# Patient Record
Sex: Female | Born: 2000 | Race: Black or African American | Hispanic: No | Marital: Single | Smoking: Never smoker
Health system: Southern US, Community
[De-identification: ages and names within clinical notes are randomized; demographics above are authoritative.]

---
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
## 2019-11-03 ENCOUNTER — Other Ambulatory Visit: Payer: Self-pay

## 2019-11-03 ENCOUNTER — Encounter (HOSPITAL_COMMUNITY): Payer: Self-pay

## 2019-11-03 ENCOUNTER — Ambulatory Visit (HOSPITAL_COMMUNITY)
Admission: EM | Admit: 2019-11-03 | Discharge: 2019-11-03 | Disposition: A | Discharge status: Home / Self Care | Payer: BC Managed Care – PPO | Attending: Family Medicine | Admitting: Family Medicine

## 2019-11-03 DIAGNOSIS — N3001 Acute cystitis with hematuria: Secondary | ICD-10-CM | POA: Insufficient documentation

## 2019-11-03 DIAGNOSIS — Z113 Encounter for screening for infections with a predominantly sexual mode of transmission: Secondary | ICD-10-CM | POA: Insufficient documentation

## 2019-11-03 DIAGNOSIS — R1030 Lower abdominal pain, unspecified: Secondary | ICD-10-CM | POA: Diagnosis present

## 2019-11-03 DIAGNOSIS — Z3202 Encounter for pregnancy test, result negative: Secondary | ICD-10-CM

## 2019-11-03 DIAGNOSIS — R319 Hematuria, unspecified: Secondary | ICD-10-CM | POA: Diagnosis not present

## 2019-11-03 LAB — POCT URINALYSIS DIPSTICK, ED / UC
Bilirubin Urine: NEGATIVE
Glucose, UA: NEGATIVE mg/dL
Ketones, ur: NEGATIVE mg/dL
Nitrite: NEGATIVE
Protein, ur: 100 mg/dL — AB
Specific Gravity, Urine: 1.025 (ref 1.005–1.030)
Urobilinogen, UA: 0.2 mg/dL (ref 0.0–1.0)
pH: 6 (ref 5.0–8.0)

## 2019-11-03 LAB — POC URINE PREG, ED: Preg Test, Ur: NEGATIVE

## 2019-11-03 MED ORDER — FLUCONAZOLE 150 MG PO TABS: 150.0000 mg | ORAL_TABLET | Freq: Every day | ORAL | 0 refills | Status: DC

## 2019-11-03 MED ORDER — NITROFURANTOIN MONOHYD MACRO 100 MG PO CAPS: 100.0000 mg | ORAL_CAPSULE | Freq: Two times a day (BID) | ORAL | 0 refills | Status: DC

## 2019-11-03 NOTE — Discharge Instructions (Addendum)
Treating you for urinary tract infection.  Make sure you are drinking plenty of fluids Sending urine for culture.  Also sending swab for testing will call you with any positive results Follow up as needed for continued or worsening symptoms

## 2019-11-03 NOTE — ED Triage Notes (Addendum)
Patient in with c/o of lower abdomen pain, and cramping after urination that started Tuesday. Also noticed blood in her urine that stopped this morning Has had Bv before, states that she feels irritation   Requesting routine sti screening  Denies n/v, fever, itching, burning, or discharge

## 2019-11-03 NOTE — ED Provider Notes (Signed)
[facility]    CSN: [id number] Arrival date & time: 11/03/19  0900      History   Chief Complaint No chief complaint on file.   HPI Meagan Smith is a 19 y.o. female.   Patient is a 19 year old female presents today for complaints of lower abdominal pain, cramping after urination and starting Tuesday.  Noticed blood in her urine.  Concern for UTI.  Denies any flank pain, nausea or fevers.  Also would like to be checked for BV and STDs.  Has history of BV.  She has had some mild discharge. Patient's last menstrual period was 11/16/2018 (exact date).      History reviewed. No pertinent past medical history.  There are no problems to display for this patient.   History reviewed. No pertinent surgical history.  OB History   No obstetric history on file.      Home Medications    Prior to Admission medications   Medication Sig Start Date End Date Taking? Authorizing Provider  nitrofurantoin, macrocrystal-monohydrate, (MACROBID) 100 MG capsule Take 1 capsule (100 mg total) by mouth 2 (two) times daily. 11/03/19   Janace Aris, NP    Family History Family History  Problem Relation Age of Onset  . Healthy Mother   . Healthy Father     Social History Social History   Tobacco Use  . Smoking status: Never Smoker  . Smokeless tobacco: Never Used  Substance Use Topics  . Alcohol use: Never  . Drug use: Never     Allergies   Patient has no known allergies.   Review of Systems Review of Systems   Physical Exam Triage Vital Signs ED Triage Vitals  Enc Vitals Group     BP 11/03/19 1003 106/65     Pulse Rate 11/03/19 1003 70     Resp 11/03/19 1003 16     Temp 11/03/19 1003 98.4 F (36.9 C)     Temp Source 11/03/19 1003 Oral     SpO2 11/03/19 1003 100 %     Weight --      Height --      Head Circumference --      Peak Flow --      Pain Score 11/03/19 1004 0     Pain Loc --      Pain Edu? --      Excl. in GC? --    No data  found.  Updated Vital Signs BP 106/65 (BP Location: Left Arm)   Pulse 70   Temp 98.4 F (36.9 C) (Oral)   Resp 16   LMP 11/16/2018 (Exact Date)   SpO2 100%   Visual Acuity Right Eye Distance:   Left Eye Distance:   Bilateral Distance:    Right Eye Near:   Left Eye Near:    Bilateral Near:     Physical Exam Vitals and nursing note reviewed.  Constitutional:      General: She is not in acute distress.    Appearance: Normal appearance. She is not ill-appearing, toxic-appearing or diaphoretic.  HENT:     Head: Normocephalic.     Nose: Nose normal.  Eyes:     Conjunctiva/sclera: Conjunctivae normal.  Pulmonary:     Effort: Pulmonary effort is normal.  Musculoskeletal:        General: Normal range of motion.     Cervical back: Normal range of motion.  Skin:    General: Skin is warm and dry.  Findings: No rash.  Neurological:     Mental Status: She is alert.  Psychiatric:        Mood and Affect: Mood normal.      UC Treatments / Results  Labs (all labs ordered are listed, but only abnormal results are displayed) Labs Reviewed  POCT URINALYSIS DIPSTICK, ED / UC - Abnormal; Notable for the following components:      Result Value   Hgb urine dipstick MODERATE (*)    Protein, ur 100 (*)    Leukocytes,Ua MODERATE (*)    All other components within normal limits  URINE CULTURE  POC URINE PREG, ED  CERVICOVAGINAL ANCILLARY ONLY    EKG   Radiology No results found.  Procedures Procedures (including critical care time)  Medications Ordered in UC Medications - No data to display  Initial Impression / Assessment and Plan / UC Course  I have reviewed the triage vital signs and the nursing notes.  Pertinent labs & imaging results that were available during my care of the patient were reviewed by me and considered in my medical decision making (see chart for details).     Acute cystitis with hematuria Urine with moderate leuks and moderate blood Sending  for culture.  Will blood and treat for urinary tract infection pending results. Also sending swab for testing. Push fluids Follow up as needed for continued or worsening symptoms  Final Clinical Impressions(s) / UC Diagnoses   Final diagnoses:  Acute cystitis with hematuria     Discharge Instructions     Treating you for urinary tract infection.  Make sure you are drinking plenty of fluids Sending urine for culture.  Also sending swab for testing will call you with any positive results Follow up as needed for continued or worsening symptoms     ED Prescriptions    Medication Sig Dispense Auth. Provider   nitrofurantoin, macrocrystal-monohydrate, (MACROBID) 100 MG capsule Take 1 capsule (100 mg total) by mouth 2 (two) times daily. 10 capsule Dahlia Byes A, NP     PDMP not reviewed this encounter.   Dahlia Byes A, NP 11/03/19 1105

## 2019-11-05 LAB — URINE CULTURE: Culture: >=100000 — AB

## 2019-11-06 LAB — CERVICOVAGINAL ANCILLARY ONLY
Bacterial Vaginitis (gardnerella): POSITIVE — AB
Candida Glabrata: NEGATIVE
Candida Vaginitis: POSITIVE — AB
Chlamydia: NEGATIVE
Comment: NEGATIVE
Comment: NEGATIVE
Comment: NEGATIVE
Comment: NEGATIVE
Comment: NEGATIVE
Comment: NORMAL
Neisseria Gonorrhea: NEGATIVE
Trichomonas: NEGATIVE

## 2019-11-07 ENCOUNTER — Telehealth (HOSPITAL_COMMUNITY): Payer: Self-pay | Admitting: Emergency Medicine

## 2019-11-07 MED ORDER — SULFAMETHOXAZOLE-TRIMETHOPRIM 800-160 MG PO TABS: 1.0000 | ORAL_TABLET | Freq: Two times a day (BID) | ORAL | 0 refills | Status: AC

## 2019-11-07 MED ORDER — METRONIDAZOLE 500 MG PO TABS
500.0000 mg | ORAL_TABLET | Freq: Two times a day (BID) | ORAL | 0 refills | Status: DC
Start: 2019-11-07 — End: 2019-11-25

## 2019-11-25 ENCOUNTER — Ambulatory Visit (HOSPITAL_COMMUNITY)
Admission: EM | Admit: 2019-11-25 | Discharge: 2019-11-25 | Disposition: A | Discharge status: Home / Self Care | Payer: BC Managed Care – PPO | Attending: Family Medicine | Admitting: Family Medicine

## 2019-11-25 ENCOUNTER — Other Ambulatory Visit: Payer: Self-pay

## 2019-11-25 ENCOUNTER — Encounter (HOSPITAL_COMMUNITY): Payer: Self-pay

## 2019-11-25 DIAGNOSIS — Z3202 Encounter for pregnancy test, result negative: Secondary | ICD-10-CM

## 2019-11-25 DIAGNOSIS — Z87440 Personal history of urinary (tract) infections: Secondary | ICD-10-CM | POA: Insufficient documentation

## 2019-11-25 DIAGNOSIS — N898 Other specified noninflammatory disorders of vagina: Secondary | ICD-10-CM | POA: Diagnosis present

## 2019-11-25 DIAGNOSIS — Z113 Encounter for screening for infections with a predominantly sexual mode of transmission: Secondary | ICD-10-CM | POA: Diagnosis not present

## 2019-11-25 DIAGNOSIS — N390 Urinary tract infection, site not specified: Secondary | ICD-10-CM | POA: Diagnosis not present

## 2019-11-25 LAB — POCT URINALYSIS DIPSTICK, ED / UC
Glucose, UA: NEGATIVE mg/dL
Ketones, ur: 80 mg/dL — AB
Leukocytes,Ua: NEGATIVE
Nitrite: NEGATIVE
Protein, ur: 100 mg/dL — AB
Specific Gravity, Urine: >=1.03 (ref 1.005–1.030)
Urobilinogen, UA: 0.2 mg/dL (ref 0.0–1.0)
pH: 6 (ref 5.0–8.0)

## 2019-11-25 LAB — POC URINE PREG, ED: Preg Test, Ur: NEGATIVE

## 2019-11-25 MED ORDER — FLUCONAZOLE 150 MG PO TABS: 150.0000 mg | ORAL_TABLET | Freq: Once | ORAL | 0 refills | Status: AC

## 2019-11-25 NOTE — Discharge Instructions (Signed)
Drink plenty of fluids  We are testing you for Gonorrhea, Chlamydia, Trichomonas, Yeast and Bacterial Vaginosis. We will call you if anything is positive and let you know if you require any further treatment. Please inform partners of any positive results.   Please return if symptoms not improving with treatment, development of fever, nausea, vomiting, abdominal pain.

## 2019-11-25 NOTE — ED Provider Notes (Signed)
[facility]    CSN: [id number] Arrival date & time: 11/25/19  1143      History   Chief Complaint Chief Complaint  Patient presents with   Urinary Tract Infection    HPI Meagan Smith is a 19 y.o. female presenting today for evaluation of possible yeast infection.  Patient was here recently on 10/7 and had UTI, yeast and BV.  Reports she did not feel the symptoms fully resolved and she has continued to have discharge.  She reports that she did not regularly take the antibiotics because of this feels the yeast was not eradicated with taking the tablets prior to completion of antibiotics.  Feels UTI symptoms have resolved.  Denies itching or irritation.  Last menstrual cycle around 10/11.  Has felt slightly off generally, has difficulty describing this or any specific symptoms.  HPI  History reviewed. No pertinent past medical history.  There are no problems to display for this patient.   History reviewed. No pertinent surgical history.  OB History   No obstetric history on file.      Home Medications    Prior to Admission medications   Medication Sig Start Date End Date Taking? Authorizing Provider  fluconazole (DIFLUCAN) 150 MG tablet Take 1 tablet (150 mg total) by mouth once for 1 dose. 11/25/19 11/25/19  Rabiah Goeser, Junius Creamer, PA-C    Family History Family History  Problem Relation Age of Onset   Healthy Mother    Healthy Father     Social History Social History   Tobacco Use   Smoking status: Never Smoker   Smokeless tobacco: Never Used  Substance Use Topics   Alcohol use: Never   Drug use: Never     Allergies   Patient has no known allergies.   Review of Systems Review of Systems  Constitutional: Negative for fever.  Respiratory: Negative for shortness of breath.   Cardiovascular: Negative for chest pain.  Gastrointestinal: Negative for abdominal pain, diarrhea, nausea and vomiting.  Genitourinary: Positive for vaginal  discharge. Negative for dysuria, flank pain, genital sores, hematuria, menstrual problem, vaginal bleeding and vaginal pain.  Musculoskeletal: Negative for back pain.  Skin: Negative for rash.  Neurological: Negative for dizziness, light-headedness and headaches.     Physical Exam Triage Vital Signs ED Triage Vitals  Enc Vitals Group     BP 11/25/19 1219 117/77     Pulse Rate 11/25/19 1219 (!) 105     Resp 11/25/19 1219 18     Temp 11/25/19 1219 99.3 F (37.4 C)     Temp Source 11/25/19 1219 Oral     SpO2 11/25/19 1219 96 %     Weight --      Height --      Head Circumference --      Peak Flow --      Pain Score 11/25/19 1218 0     Pain Loc --      Pain Edu? --      Excl. in GC? --    No data found.  Updated Vital Signs BP 117/77 (BP Location: Right Arm)    Pulse (!) 105    Temp 99.3 F (37.4 C) (Oral)    Resp 18    LMP 11/07/2019    SpO2 96%   Visual Acuity Right Eye Distance:   Left Eye Distance:   Bilateral Distance:    Right Eye Near:   Left Eye Near:    Bilateral Near:  Physical Exam Vitals and nursing note reviewed.  Constitutional:      Appearance: She is well-developed.     Comments: No acute distress  HENT:     Head: Normocephalic and atraumatic.     Nose: Nose normal.  Eyes:     Conjunctiva/sclera: Conjunctivae normal.  Cardiovascular:     Rate and Rhythm: Normal rate.  Pulmonary:     Effort: Pulmonary effort is normal. No respiratory distress.  Abdominal:     General: There is no distension.  Musculoskeletal:        General: Normal range of motion.     Cervical back: Neck supple.  Skin:    General: Skin is warm and dry.  Neurological:     Mental Status: She is alert and oriented to person, place, and time.      UC Treatments / Results  Labs (all labs ordered are listed, but only abnormal results are displayed) Labs Reviewed  POCT URINALYSIS DIPSTICK, ED / UC - Abnormal; Notable for the following components:      Result Value    Bilirubin Urine MODERATE (*)    Ketones, ur 80 (*)    Hgb urine dipstick TRACE (*)    Protein, ur 100 (*)    All other components within normal limits  POC URINE PREG, ED  CERVICOVAGINAL ANCILLARY ONLY    EKG   Radiology No results found.  Procedures Procedures (including critical care time)  Medications Ordered in UC Medications - No data to display  Initial Impression / Assessment and Plan / UC Course  I have reviewed the triage vital signs and the nursing notes.  Pertinent labs & imaging results that were available during my care of the patient were reviewed by me and considered in my medical decision making (see chart for details).     UA with negative leuks and nitrites, does appear concentrated/dehydrated, stressed pushing fluids.  Empirically treating for yeast with Diflucan.  Repeat vaginal swab pending to rescreen for any persistent vaginal infections.  Will alter therapy as needed based off results.  Discussed strict return precautions. Patient verbalized understanding and is agreeable with plan.  Final Clinical Impressions(s) / UC Diagnoses   Final diagnoses:  Vaginal discharge     Discharge Instructions     Drink plenty of fluids  We are testing you for Gonorrhea, Chlamydia, Trichomonas, Yeast and Bacterial Vaginosis. We will call you if anything is positive and let you know if you require any further treatment. Please inform partners of any positive results.   Please return if symptoms not improving with treatment, development of fever, nausea, vomiting, abdominal pain.    ED Prescriptions    Medication Sig Dispense Auth. Provider   fluconazole (DIFLUCAN) 150 MG tablet Take 1 tablet (150 mg total) by mouth once for 1 dose. 2 tablet Mary Hockey, [place] C, PA-C     PDMP not reviewed this encounter.   Lew Dawes, PA-C 11/25/19 1252

## 2019-11-25 NOTE — ED Triage Notes (Signed)
Patient in stating that she just feels "off" since her period on 11/07/19. Was recently treated about 3 weeks ago for BV/ UTI.    states she finished prescribed antibiotic but was not taking it as scheduled and that she feels she might have yeast infection  States she took at home preg test with negative results  Denies vaginal irritation, dysuria, or vaginal discharge

## 2019-11-28 LAB — CERVICOVAGINAL ANCILLARY ONLY
Bacterial Vaginitis (gardnerella): NEGATIVE
Candida Glabrata: NEGATIVE
Candida Vaginitis: POSITIVE — AB
Chlamydia: NEGATIVE
Comment: NEGATIVE
Comment: NEGATIVE
Comment: NEGATIVE
Comment: NEGATIVE
Comment: NEGATIVE
Comment: NORMAL
Neisseria Gonorrhea: NEGATIVE
Trichomonas: NEGATIVE

## 2021-12-24 DIAGNOSIS — F411 Generalized anxiety disorder: Secondary | ICD-10-CM | POA: Diagnosis not present

## 2021-12-31 DIAGNOSIS — F411 Generalized anxiety disorder: Secondary | ICD-10-CM | POA: Diagnosis not present

## 2022-01-07 DIAGNOSIS — F411 Generalized anxiety disorder: Secondary | ICD-10-CM | POA: Diagnosis not present

## 2022-05-01 DIAGNOSIS — Z113 Encounter for screening for infections with a predominantly sexual mode of transmission: Secondary | ICD-10-CM | POA: Diagnosis not present

## 2022-05-01 DIAGNOSIS — Z114 Encounter for screening for human immunodeficiency virus [HIV]: Secondary | ICD-10-CM | POA: Diagnosis not present

## 2022-06-03 DIAGNOSIS — F411 Generalized anxiety disorder: Secondary | ICD-10-CM | POA: Diagnosis not present

## 2022-06-17 DIAGNOSIS — F411 Generalized anxiety disorder: Secondary | ICD-10-CM | POA: Diagnosis not present

## 2022-07-01 DIAGNOSIS — F411 Generalized anxiety disorder: Secondary | ICD-10-CM | POA: Diagnosis not present

## 2022-07-15 DIAGNOSIS — F411 Generalized anxiety disorder: Secondary | ICD-10-CM | POA: Diagnosis not present

## 2022-07-29 DIAGNOSIS — F411 Generalized anxiety disorder: Secondary | ICD-10-CM | POA: Diagnosis not present

## 2022-08-12 DIAGNOSIS — F411 Generalized anxiety disorder: Secondary | ICD-10-CM | POA: Diagnosis not present

## 2022-08-26 DIAGNOSIS — F411 Generalized anxiety disorder: Secondary | ICD-10-CM | POA: Diagnosis not present

## 2022-09-09 DIAGNOSIS — F411 Generalized anxiety disorder: Secondary | ICD-10-CM | POA: Diagnosis not present

## 2022-09-23 DIAGNOSIS — F411 Generalized anxiety disorder: Secondary | ICD-10-CM | POA: Diagnosis not present

## 2022-10-07 DIAGNOSIS — F411 Generalized anxiety disorder: Secondary | ICD-10-CM | POA: Diagnosis not present

## 2022-10-21 DIAGNOSIS — F411 Generalized anxiety disorder: Secondary | ICD-10-CM | POA: Diagnosis not present

## 2022-11-05 DIAGNOSIS — F411 Generalized anxiety disorder: Secondary | ICD-10-CM | POA: Diagnosis not present

## 2022-11-18 DIAGNOSIS — F411 Generalized anxiety disorder: Secondary | ICD-10-CM | POA: Diagnosis not present

## 2023-04-24 ENCOUNTER — Ambulatory Visit (HOSPITAL_COMMUNITY)
Admission: EM | Admit: 2023-04-24 | Discharge: 2023-04-24 | Disposition: A | Discharge status: Home / Self Care | Attending: Emergency Medicine | Admitting: Emergency Medicine

## 2023-04-24 ENCOUNTER — Encounter (HOSPITAL_COMMUNITY): Payer: Self-pay | Admitting: *Deleted

## 2023-04-24 DIAGNOSIS — H5789 Other specified disorders of eye and adnexa: Secondary | ICD-10-CM | POA: Diagnosis not present

## 2023-04-24 DIAGNOSIS — H00014 Hordeolum externum left upper eyelid: Secondary | ICD-10-CM | POA: Diagnosis not present

## 2023-04-24 MED ORDER — AMOXICILLIN-POT CLAVULANATE 875-125 MG PO TABS: 1.0000 | ORAL_TABLET | Freq: Two times a day (BID) | ORAL | 0 refills | Status: DC

## 2023-04-24 MED ORDER — ERYTHROMYCIN 5 MG/GM OP OINT: TOPICAL_OINTMENT | OPHTHALMIC | 0 refills | Status: DC

## 2023-04-24 NOTE — ED Triage Notes (Signed)
 Pt states she had a stye on her left eye she used a potato to decrease her stye. She states she slept with the potato on her eye and her contact in her eye.  Now she has a lot of eye irritation.

## 2023-04-24 NOTE — ED Provider Notes (Signed)
 [facility]    CSN: [id number] Arrival date & time: 04/24/23  0809      History   Chief Complaint Chief Complaint  Patient presents with   Eye Problem    HPI Meagan Smith is a 23 y.o. female.   Patient presents today with left eye swelling and erythema and a bump above the upper lid.  Patient states that this has been going on for approximately 3 days.  Last night she slept with a contact in and read on the Internet to apply a potato to her eye.  This does not make the area any better.  Patient denies any visual changes no pressure behind the eye.  Does have an ophthalmologist that she sees regularly    History reviewed. No pertinent past medical history.  There are no active problems to display for this patient.   History reviewed. No pertinent surgical history.  OB History   No obstetric history on file.      Home Medications    Prior to Admission medications   Medication Sig Start Date End Date Taking? Authorizing Provider  amoxicillin-clavulanate (AUGMENTIN) 875-125 MG tablet Take 1 tablet by mouth every 12 (twelve) hours. 04/24/23  Yes Coralyn Mark, NP  erythromycin ophthalmic ointment Place a 1/2 inch ribbon of ointment into the lower eyelid. 04/24/23  Yes Coralyn Mark, NP    Family History Family History  Problem Relation Age of Onset   Healthy Mother    Healthy Father     Social History Social History   Tobacco Use   Smoking status: Never   Smokeless tobacco: Never  Vaping Use   Vaping status: Never Used  Substance Use Topics   Alcohol use: Never   Drug use: Never     Allergies   Patient has no known allergies.   Review of Systems Review of Systems  Constitutional: Negative.   Eyes:  Positive for discharge and redness. Negative for photophobia, pain, itching and visual disturbance.  Respiratory: Negative.    Cardiovascular: Negative.   Skin:        Small amount of edema noted to surrounding tissue of the  left eye small pea-sized nodule of the above left upper lid slight redness  Neurological: Negative.      Physical Exam Triage Vital Signs ED Triage Vitals  Encounter Vitals Group     BP 04/24/23 0832 102/64     Systolic BP Percentile --      Diastolic BP Percentile --      Pulse Rate 04/24/23 0832 76     Resp 04/24/23 0832 16     Temp 04/24/23 0832 97.8 F (36.6 C)     Temp Source 04/24/23 0832 Oral     SpO2 04/24/23 0832 100 %     Weight --      Height --      Head Circumference --      Peak Flow --      Pain Score 04/24/23 0831 0     Pain Loc --      Pain Education --      Exclude from Growth Chart --    No data found.  Updated Vital Signs BP 102/64 (BP Location: Right Arm)   Pulse 76   Temp 97.8 F (36.6 C) (Oral)   Resp 16   LMP 03/31/2023 (Approximate)   SpO2 100%   Visual Acuity Right Eye Distance:   Left Eye Distance:   Bilateral Distance:  Right Eye Near:   Left Eye Near:    Bilateral Near:     Physical Exam Constitutional:      Appearance: Normal appearance.  Eyes:     General:        Left eye: Discharge present.    Extraocular Movements: Extraocular movements intact.     Conjunctiva/sclera: Conjunctivae normal.     Pupils: Pupils are equal, round, and reactive to light.      Comments: Slight erythema and small amount of edema noted around left lower lid.  Patient has nodule level of the upper bleph   Cardiovascular:     Rate and Rhythm: Normal rate.  Pulmonary:     Effort: Pulmonary effort is normal.  Skin:    Findings: Erythema present.  Neurological:     General: No focal deficit present.     Mental Status: She is alert.      UC Treatments / Results  Labs (all labs ordered are listed, but only abnormal results are displayed) Labs Reviewed - No data to display  EKG   Radiology No results found.  Procedures Procedures (including critical care time)  Medications Ordered in UC Medications - No data to display  Initial  Impression / Assessment and Plan / UC Course  I have reviewed the triage vital signs and the nursing notes.  Pertinent labs & imaging results that were available during my care of the patient were reviewed by me and considered in my medical decision making (see chart for details).     Use warm compresses several times throughout the day Avoid wearing any contacts for at least 3 to 5 days Follow-up with ophthalmology Can use saline drops as needed If symptoms come worse or pressure behind the eye patient will need to follow-up in the ER We will call or cover for possible cellulitis to surronding tissues   Final Clinical Impressions(s) / UC Diagnoses   Final diagnoses:  Hordeolum externum of left upper eyelid  Eye irritation     Discharge Instructions      Use warm compresses several times throughout the day Avoid wearing any contacts for at least 3 to 5 days Follow-up with ophthalmology Can use saline drops as needed If symptoms come worse or pressure behind the eye patient will need to follow-up in the ER     ED Prescriptions     Medication Sig Dispense Auth. Provider   erythromycin ophthalmic ointment Place a 1/2 inch ribbon of ointment into the lower eyelid. 3.5 g Maple Mirza L, NP   amoxicillin-clavulanate (AUGMENTIN) 875-125 MG tablet Take 1 tablet by mouth every 12 (twelve) hours. 14 tablet Coralyn Mark, NP      PDMP not reviewed this encounter.   Coralyn Mark, NP 04/24/23 [phone]

## 2023-04-24 NOTE — Discharge Instructions (Signed)
 Use warm compresses several times throughout the day Avoid wearing any contacts for at least 3 to 5 days Follow-up with ophthalmology Can use saline drops as needed If symptoms come worse or pressure behind the eye patient will need to follow-up in the ER

## 2023-08-13 ENCOUNTER — Encounter (HOSPITAL_COMMUNITY): Payer: Self-pay | Admitting: *Deleted

## 2023-08-13 ENCOUNTER — Ambulatory Visit (HOSPITAL_COMMUNITY)
Admission: EM | Admit: 2023-08-13 | Discharge: 2023-08-13 | Disposition: A | Discharge status: Home / Self Care | Attending: Family Medicine | Admitting: Family Medicine

## 2023-08-13 ENCOUNTER — Other Ambulatory Visit: Payer: Self-pay

## 2023-08-13 DIAGNOSIS — M2550 Pain in unspecified joint: Secondary | ICD-10-CM | POA: Insufficient documentation

## 2023-08-13 DIAGNOSIS — R5383 Other fatigue: Secondary | ICD-10-CM | POA: Diagnosis not present

## 2023-08-13 DIAGNOSIS — R531 Weakness: Secondary | ICD-10-CM | POA: Insufficient documentation

## 2023-08-13 LAB — SEDIMENTATION RATE: Sed Rate: 2 mm/h (ref 0–22)

## 2023-08-13 LAB — CBC WITH DIFFERENTIAL/PLATELET
Abs Immature Granulocytes: 0.01 K/uL (ref 0.00–0.07)
Basophils Absolute: 0.1 K/uL (ref 0.0–0.1)
Basophils Relative: 1 %
Eosinophils Absolute: 0.5 K/uL (ref 0.0–0.5)
Eosinophils Relative: 8 %
HCT: 40.5 % (ref 36.0–46.0)
Hemoglobin: 12.6 g/dL (ref 12.0–15.0)
Immature Granulocytes: 0 %
Lymphocytes Relative: 22 %
Lymphs Abs: 1.3 K/uL (ref 0.7–4.0)
MCH: 26.4 pg (ref 26.0–34.0)
MCHC: 31.1 g/dL (ref 30.0–36.0)
MCV: 84.9 fL (ref 80.0–100.0)
Monocytes Absolute: 0.3 K/uL (ref 0.1–1.0)
Monocytes Relative: 5 %
Neutro Abs: 3.8 K/uL (ref 1.7–7.7)
Neutrophils Relative %: 64 %
Platelets: 329 K/uL (ref 150–400)
RBC: 4.77 MIL/uL (ref 3.87–5.11)
RDW: 12.7 % (ref 11.5–15.5)
WBC: 5.9 K/uL (ref 4.0–10.5)
nRBC: 0 % (ref 0.0–0.2)

## 2023-08-13 LAB — COMPREHENSIVE METABOLIC PANEL WITH GFR
ALT: 14 U/L (ref 0–44)
AST: 20 U/L (ref 15–41)
Albumin: 4.1 g/dL (ref 3.5–5.0)
Alkaline Phosphatase: 77 U/L (ref 38–126)
Anion gap: 9 (ref 5–15)
BUN: 7 mg/dL (ref 6–20)
CO2: 24 mmol/L (ref 22–32)
Calcium: 9.6 mg/dL (ref 8.9–10.3)
Chloride: 103 mmol/L (ref 98–111)
Creatinine, Ser: 0.7 mg/dL (ref 0.44–1.00)
GFR, Estimated: >60 mL/min (ref >60)
Glucose, Bld: 70 mg/dL (ref 70–99)
Potassium: 3.9 mmol/L (ref 3.5–5.1)
Sodium: 136 mmol/L (ref 135–145)
Total Bilirubin: 0.5 mg/dL (ref 0.0–1.2)
Total Protein: 7.4 g/dL (ref 6.5–8.1)

## 2023-08-13 LAB — VITAMIN B12: Vitamin B-12: 352 pg/mL (ref 180–914)

## 2023-08-13 LAB — POCT FASTING CBG KUC MANUAL ENTRY: POCT Glucose (KUC): 101 mg/dL — AB (ref 70–99)

## 2023-08-13 NOTE — ED Triage Notes (Signed)
 C/O generalized weakness onset last night; states started feeling like her arms were weak and heavy while doing her hair last night, then noticed she just felt generalized weakness.  Also states lately I've been experiencing joint pain.

## 2023-08-13 NOTE — Discharge Instructions (Addendum)
 Your sugar was 101, which was normal.  We have drawn blood to check your blood counts, electrolytes and kidney and liver function, thyroid function, B12, and markers for inflammation.  If anything is significantly abnormal, our staff will call you.  If it is all normal they will not.  All results will go directly to your MyChart also.  If you worsen significantly in any way, including significant shortness of breath or dizziness, please go to the emergency room for further evaluation  You can use the QR code/website at the back of the summary paperwork to schedule yourself a new patient appointment with primary care

## 2023-08-13 NOTE — ED Provider Notes (Signed)
 [facility]    CSN: [id number] Arrival date & time: 08/13/23  0843      History   Chief Complaint Chief Complaint  Patient presents with   Weakness    HPI Meagan Smith is a 23 y.o. female.    Weakness  Here for weakness and fatigue.  It began for sure last night where she felt like her arms were very heavy.  Today it is even more pronounced and she feels like it is just hard to move or walk.  Maybe a little short of breath.  No fever or chills and no upper respiratory symptoms.  No nausea or vomiting or diarrhea.  Bowel movements have been normal.  She does have an occasional suprapubic pain, but she wonders if that is because she is ovulating.  No rash and no swelling, but she has had some intermittent joint pains, including in her hands and wrists and possibly ankles and feet.  The hands and wrist bothered her specifically when she had been doing her hair a couple of nights ago.  Last menstrual cycle was July 4, and was on time.  Family history is negative for diabetes or autoimmune disease that she knows of.   she has felt tired are off sometimes after eating a meal.    History reviewed. No pertinent past medical history.  There are no active problems to display for this patient.   History reviewed. No pertinent surgical history.  OB History   No obstetric history on file.      Home Medications    Prior to Admission medications   Not on File    Family History Family History  Problem Relation Age of Onset   Healthy Mother    Healthy Father     Social History Social History   Tobacco Use   Smoking status: Never   Smokeless tobacco: Never  Vaping Use   Vaping status: Never Used  Substance Use Topics   Alcohol use: Yes    Comment: occasional wine   Drug use: Never     Allergies   Patient has no known allergies.   Review of Systems Review of Systems  Neurological:  Positive for weakness.     Physical Exam Triage Vital  Signs ED Triage Vitals [08/13/23 0906]  Encounter Vitals Group     BP 108/67     Girls Systolic BP Percentile      Girls Diastolic BP Percentile      Boys Systolic BP Percentile      Boys Diastolic BP Percentile      Pulse Rate 66     Resp 16     Temp 98.3 F (36.8 C)     Temp Source Oral     SpO2 98 %     Weight      Height      Head Circumference      Peak Flow      Pain Score 0     Pain Loc      Pain Education      Exclude from Growth Chart    No data found.  Updated Vital Signs BP 108/67   Pulse 66   Temp 98.3 F (36.8 C) (Oral)   Resp 16   LMP 07/31/2023 (Approximate)   SpO2 98%   Visual Acuity Right Eye Distance:   Left Eye Distance:   Bilateral Distance:    Right Eye Near:   Left Eye Near:    Bilateral Near:  Physical Exam Vitals reviewed.  Constitutional:      General: She is not in acute distress.    Appearance: She is not toxic-appearing.  HENT:     Nose: Nose normal.     Mouth/Throat:     Mouth: Mucous membranes are moist.     Pharynx: No oropharyngeal exudate or posterior oropharyngeal erythema.  Eyes:     Extraocular Movements: Extraocular movements intact.     Conjunctiva/sclera: Conjunctivae normal.     Pupils: Pupils are equal, round, and reactive to light.  Cardiovascular:     Rate and Rhythm: Normal rate and regular rhythm.     Heart sounds: No murmur heard. Pulmonary:     Effort: Pulmonary effort is normal. No respiratory distress.     Breath sounds: No stridor. No wheezing, rhonchi or rales.  Abdominal:     General: Bowel sounds are normal. There is no distension.     Palpations: Abdomen is soft.     Tenderness: There is no abdominal tenderness. There is no guarding.  Musculoskeletal:        General: No swelling, tenderness or deformity.     Cervical back: Neck supple.     Right lower leg: No edema.     Left lower leg: No edema.     Comments: No swelling or bogginess of the joints of her wrists or hands.  Lymphadenopathy:      Cervical: No cervical adenopathy.  Skin:    Capillary Refill: Capillary refill takes less than 2 seconds.     Coloration: Skin is not jaundiced or pale.  Neurological:     General: No focal deficit present.     Mental Status: She is alert and oriented to person, place, and time.  Psychiatric:        Behavior: Behavior normal.      UC Treatments / Results  Labs (all labs ordered are listed, but only abnormal results are displayed) Labs Reviewed  POCT FASTING CBG [facility] MANUAL ENTRY - Abnormal; Notable for the following components:      Result Value   POCT Glucose ([facility]) 101 (*)    All other components within normal limits  CBC WITH DIFFERENTIAL/PLATELET  COMPREHENSIVE METABOLIC PANEL WITH GFR  TSH  VITAMIN B12  SEDIMENTATION RATE  RHEUMATOID FACTOR    EKG   Radiology No results found.  Procedures Procedures (including critical care time)  Medications Ordered in UC Medications - No data to display  Initial Impression / Assessment and Plan / UC Course  I have reviewed the triage vital signs and the nursing notes.  Pertinent labs & imaging results that were available during my care of the patient were reviewed by me and considered in my medical decision making (see chart for details).     Fingerstick glucose is 101.  We have drawn labs to assess CBC, CMP, rheumatoid factor and sed rate, B12 and thyroid function.  Our staff will notify everything is significantly abnormal.  She will go to emergency room if she worsens significantly  Staff will assist her in making a PCP appointment.  She does not have a primary care at this time Final Clinical Impressions(s) / UC Diagnoses   Final diagnoses:  Weakness  Fatigue, unspecified type  Arthralgia, unspecified joint     Discharge Instructions      Your sugar was 101, which was normal.  We have drawn blood to check your blood counts, electrolytes and kidney and liver function, thyroid function, B12, and markers  for inflammation.  If anything is significantly abnormal, our staff will call you.  If it is all normal they will not.  All results will go directly to your MyChart also.  If you worsen significantly in any way, including significant shortness of breath or dizziness, please go to the emergency room for further evaluation  You can use the QR code/website at the back of the summary paperwork to schedule yourself a new patient appointment with primary care      ED Prescriptions   None    PDMP not reviewed this encounter.   Vonna Sharlet POUR, MD 08/13/23 [phone]

## 2023-08-14 ENCOUNTER — Ambulatory Visit (HOSPITAL_COMMUNITY): Payer: Self-pay

## 2023-08-14 LAB — RHEUMATOID FACTOR: Rheumatoid fact SerPl-aCnc: <10 [IU]/mL (ref <14.0)

## 2023-09-19 ENCOUNTER — Emergency Department (HOSPITAL_COMMUNITY)

## 2023-09-19 ENCOUNTER — Emergency Department (HOSPITAL_COMMUNITY)
Admission: EM | Admit: 2023-09-19 | Discharge: 2023-09-19 | Disposition: A | Discharge status: Home / Self Care | Attending: Emergency Medicine | Admitting: Emergency Medicine

## 2023-09-19 ENCOUNTER — Encounter (HOSPITAL_COMMUNITY): Payer: Self-pay | Admitting: *Deleted

## 2023-09-19 ENCOUNTER — Other Ambulatory Visit: Payer: Self-pay

## 2023-09-19 DIAGNOSIS — R079 Chest pain, unspecified: Secondary | ICD-10-CM | POA: Diagnosis not present

## 2023-09-19 DIAGNOSIS — R0789 Other chest pain: Secondary | ICD-10-CM | POA: Diagnosis not present

## 2023-09-19 LAB — CBC
HCT: 36.2 % (ref 36.0–46.0)
Hemoglobin: 11.4 g/dL — ABNORMAL LOW (ref 12.0–15.0)
MCH: 27 pg (ref 26.0–34.0)
MCHC: 31.5 g/dL (ref 30.0–36.0)
MCV: 85.6 fL (ref 80.0–100.0)
Platelets: 343 K/uL (ref 150–400)
RBC: 4.23 MIL/uL (ref 3.87–5.11)
RDW: 12.3 % (ref 11.5–15.5)
WBC: 6.6 K/uL (ref 4.0–10.5)
nRBC: 0 % (ref 0.0–0.2)

## 2023-09-19 LAB — BASIC METABOLIC PANEL WITH GFR
Anion gap: 7 (ref 5–15)
BUN: 8 mg/dL (ref 6–20)
CO2: 22 mmol/L (ref 22–32)
Calcium: 9.2 mg/dL (ref 8.9–10.3)
Chloride: 107 mmol/L (ref 98–111)
Creatinine, Ser: 0.76 mg/dL (ref 0.44–1.00)
GFR, Estimated: >60 mL/min (ref >60)
Glucose, Bld: 115 mg/dL — ABNORMAL HIGH (ref 70–99)
Potassium: 3.7 mmol/L (ref 3.5–5.1)
Sodium: 136 mmol/L (ref 135–145)

## 2023-09-19 LAB — HCG, SERUM, QUALITATIVE: Preg, Serum: NEGATIVE

## 2023-09-19 LAB — TROPONIN I (HIGH SENSITIVITY): Troponin I (High Sensitivity): 2 ng/L (ref <18)

## 2023-09-19 MED ORDER — KETOROLAC TROMETHAMINE 15 MG/ML IJ SOLN
15.0000 mg | Freq: Once | INTRAMUSCULAR | Status: AC
  Administered 2023-09-19: 15 mg via INTRAMUSCULAR
  Filled 2023-09-19: qty 1

## 2023-09-19 NOTE — ED Triage Notes (Signed)
 States she went to [facility] 1 month ago with feeling very tired and had blood work done was a work up for inflammation , has an appointment 08/27 however this am woke up with chest pain and headche.

## 2023-09-19 NOTE — Discharge Instructions (Addendum)
 Follow-up with your primary care doctor at your scheduled appointment.  Return for any emergent symptoms.  No concerning findings for your chest pain identified today.  Take ibuprofen 600 mg until your follow-up.

## 2023-09-19 NOTE — ED Provider Notes (Signed)
 [place] [facility] Provider Note   CSN: [id number] Arrival date & time: 09/19/23  1326     Patient presents with: No chief complaint on file.   Meagan Smith is a 23 y.o. female.   73 female presents today for concern of chest pain.  She states this started last night.  Has no prior history of CAD, PE, DVT.  No family history of CAD, or clotting disorder.  Mom is at bedside and helps with history.  Chest pain is not pleuritic or reproducible.  It does wax and wane.  Currently pain is not significant.  Pain does not radiate anywhere.  No associated shortness of breath.  No recent long travel, no leg pain, or leg swelling, no history of cancer or surgery.  The history is provided by the patient. No language interpreter was used.       Prior to Admission medications   Not on File    Allergies: Patient has no known allergies.    Review of Systems  Constitutional:  Negative for chills and fever.  Respiratory:  Negative for cough and shortness of breath.   Cardiovascular:  Positive for chest pain. Negative for palpitations and leg swelling.  Gastrointestinal:  Negative for abdominal pain.  Neurological:  Negative for light-headedness and headaches.  All other systems reviewed and are negative.   Updated Vital Signs BP 122/73 (BP Location: Right Arm)   Pulse 89   Temp 97.8 F (36.6 C)   Resp 15   Ht 5' 7 (1.702 m)   Wt 68 kg   LMP 08/03/2023   SpO2 100%   BMI 23.49 kg/m   Physical Exam Vitals and nursing note reviewed.  Constitutional:      General: She is not in acute distress.    Appearance: Normal appearance. She is not ill-appearing.  HENT:     Head: Normocephalic and atraumatic.     Nose: Nose normal.  Eyes:     Conjunctiva/sclera: Conjunctivae normal.  Cardiovascular:     Rate and Rhythm: Normal rate and regular rhythm.  Pulmonary:     Effort: Pulmonary effort is normal. No respiratory distress.  Musculoskeletal:         General: Normal range of motion.     Cervical back: Normal range of motion.     Right lower leg: No edema.     Left lower leg: No edema.  Skin:    Findings: No rash.  Neurological:     Mental Status: She is alert.     (all labs ordered are listed, but only abnormal results are displayed) Labs Reviewed  BASIC METABOLIC PANEL WITH GFR - Abnormal; Notable for the following components:      Result Value   Glucose, Bld 115 (*)    All other components within normal limits  CBC - Abnormal; Notable for the following components:   Hemoglobin 11.4 (*)    All other components within normal limits  HCG, SERUM, QUALITATIVE  TROPONIN I (HIGH SENSITIVITY)  TROPONIN I (HIGH SENSITIVITY)    EKG: None  Radiology: DG Chest 2 View Result Date: 09/19/2023 CLINICAL DATA:  Chest pain. EXAM: CHEST - 2 VIEW COMPARISON:  Chest radiograph dated 03/19/2015. FINDINGS: The heart size and mediastinal contours are within normal limits. Both lungs are clear. The visualized skeletal structures are unremarkable. IMPRESSION: No active cardiopulmonary disease. Electronically Signed   By: Vanetta Chou M.D.   On: 09/19/2023 15:29     Procedures  Medications Ordered in the ED  ketorolac  (TORADOL ) 15 MG/ML injection 15 mg (has no administration in time range)                                    Medical Decision Making Amount and/or Complexity of Data Reviewed Labs: ordered. Radiology: ordered.  Risk Prescription drug management.   Medical Decision Making / ED Course   This patient presents to the ED for concern of chest pain, this involves an extensive number of treatment options, and is a complaint that carries with it a high risk of complications and morbidity.  The differential diagnosis includes ACS, PE, pneumonia, MSK etiology, GERD, dissection.  Low suspicion for ACS and dissection based off of history and physical exam.  Low risk for PE on Wells criteria.  MDM: 23 year old female  presents today for concern of chest pain. Atypical for ACS.  Troponin negative.  EKG without acute ischemic change.  Chest x-ray without acute cardiopulmonary process.  Low suspicion for ACS.  CBC without leukocytosis.  Mild anemia but not significantly below her baseline.  No evidence of acute GI bleed.  BMP with glucose 115 otherwise without acute concern. Discharged in stable condition.  Return precaution discussed.  Patient voices understanding and is in agreement with plan.  Toradol  provided in the emergency department.  Discussed NSAID trial until she follows up with her PCP on Thursday which is already scheduled.  Patient and mom voiced understanding and are in agreement with plan.   Additional history obtained: -Additional history obtained from Mom at bedside. Chart review. -External records from outside source obtained and reviewed including: Chart review including previous notes, labs, imaging, consultation notes   Lab Tests: -I ordered, reviewed, and interpreted labs.   The pertinent results include:   Labs Reviewed  BASIC METABOLIC PANEL WITH GFR - Abnormal; Notable for the following components:      Result Value   Glucose, Bld 115 (*)    All other components within normal limits  CBC - Abnormal; Notable for the following components:   Hemoglobin 11.4 (*)    All other components within normal limits  HCG, SERUM, QUALITATIVE  TROPONIN I (HIGH SENSITIVITY)  TROPONIN I (HIGH SENSITIVITY)      EKG  EKG Interpretation Date/Time:    Ventricular Rate:    PR Interval:    QRS Duration:    QT Interval:    QTC Calculation:   R Axis:      Text Interpretation:           Imaging Studies ordered: I ordered imaging studies including cxr I independently visualized and interpreted imaging. I agree with the radiologist interpretation   Medicines ordered and prescription drug management: Meds ordered this encounter  Medications   ketorolac  (TORADOL ) 15 MG/ML injection 15  mg    -I have reviewed the patients home medicines and have made adjustments as needed   Reevaluation: After the interventions noted above, I reevaluated the patient and found that they have :improved  Co morbidities that complicate the patient evaluation History reviewed. No pertinent past medical history.    Dispostion: Discharged in stable condition.  Return precaution discussed.  Patient voices understanding and is in agreement with plan.   Final diagnoses:  Atypical chest pain    ED Discharge Orders     None          Hildegard Loge, [place] 09/19/23 1601  Doretha Folks, MD 09/21/23 0001

## 2023-09-23 ENCOUNTER — Encounter: Payer: Self-pay | Admitting: Family Medicine

## 2023-09-23 ENCOUNTER — Ambulatory Visit (INDEPENDENT_AMBULATORY_CARE_PROVIDER_SITE_OTHER): Admitting: Family Medicine

## 2023-09-23 VITALS — BP 99/55 | HR 99 | Resp 16 | Ht 67.0 in | Wt 140.0 lb

## 2023-09-23 DIAGNOSIS — F321 Major depressive disorder, single episode, moderate: Secondary | ICD-10-CM | POA: Diagnosis not present

## 2023-09-23 DIAGNOSIS — R002 Palpitations: Secondary | ICD-10-CM

## 2023-09-23 DIAGNOSIS — F411 Generalized anxiety disorder: Secondary | ICD-10-CM

## 2023-09-23 DIAGNOSIS — Z6821 Body mass index (BMI) 21.0-21.9, adult: Secondary | ICD-10-CM

## 2023-09-23 DIAGNOSIS — M2550 Pain in unspecified joint: Secondary | ICD-10-CM

## 2023-09-23 DIAGNOSIS — R4589 Other symptoms and signs involving emotional state: Secondary | ICD-10-CM

## 2023-09-23 DIAGNOSIS — Z833 Family history of diabetes mellitus: Secondary | ICD-10-CM

## 2023-09-23 DIAGNOSIS — R5382 Chronic fatigue, unspecified: Secondary | ICD-10-CM

## 2023-09-23 MED ORDER — ESCITALOPRAM OXALATE 5 MG PO TABS
5.0000 mg | ORAL_TABLET | Freq: Every day | ORAL | 1 refills | Status: DC
Start: 2023-09-23 — End: 2023-10-27

## 2023-09-23 MED ORDER — HYDROXYZINE HCL 10 MG PO TABS: 10.0000 mg | ORAL_TABLET | Freq: Two times a day (BID) | ORAL | 0 refills | Status: DC | PRN

## 2023-09-23 NOTE — Progress Notes (Signed)
 Established Patient Office Visit  Introduced to nurse practitioner role and practice setting.  All questions answered.  Discussed provider/patient relationship and expectations.  Subjective   Patient ID: Meagan Smith, female    DOB: [birth year]  Age: 23 y.o. MRN: [id number]  Chief Complaint  Patient presents with   New Patient (Initial Visit)    NP/Est Care   Discussed the use of AI scribe software for clinical note transcription with the patient, who gave verbal consent to proceed.  History of Present Illness Meagan Smith is a 23 year old female who presents with fatigue, hx of chest pain, and sensitivity to certain foods and chemicals. She is accompanied by her mother, Meagan Smith.  She has been experiencing significant fatigue and weakness for the past two months, which has worsened in the last month. She visited the emergency department about a month ago, where blood tests were conducted, unremarkable findings for weakness.   She notes a sensitivity to certain foods and chemicals, such as processed foods and high chemical content items like cleaning supplies and certain lotions, which cause discomfort, including joint pain and a sensation of inflammation.  HX of L chest pain/ precordial pain, described as a sharp pain around the heart area, significant enough to wake her at night. This led her to seek emergency care on 09/19/23, where an EKG and chest X-ray were performed, with results reported as normal. She has not experienced the chest pain since that incident.  Over the past two months, she has experienced headaches, dizziness, and a general feeling of weakness and fatigue. She denies any significant life changes except for starting a new job at the [employer] a few months ago.  She has a history of menstrual cramps and was previously tested for PCOS, which was ruled out after an ultrasound. Her menstrual cycles are regular, occurring every four weeks and lasting about five  days. She tried birth control in the past, but it was not effective for her.  She has experienced palpitations, describing episodes of her heart racing. She has a family history of diabetes and hypertension, with her father having type 2 diabetes and her mother having hypertension. She is concerned about potential hereditary conditions, including lupus, which she suspects might run in her family.  She has a history of anxiety and depression, which she feels have been exacerbated by her current health concerns. She has not taken medication for these conditions in the past but is considering it now. She is planning to reconnect with her therapist to address these issues.      09/23/2023   10:37 AM  Depression screen PHQ 2/9  Decreased Interest 3  Down, Depressed, Hopeless 3  PHQ - 2 Score 6  Altered sleeping 3  Tired, decreased energy 3  Change in appetite 3  Feeling bad or failure about yourself  3  Trouble concentrating 3  Moving slowly or fidgety/restless 1  Suicidal thoughts 1  PHQ-9 Score 23  Difficult doing work/chores Somewhat difficult       09/23/2023   10:38 AM  GAD 7 : Generalized Anxiety Score  Nervous, Anxious, on Edge 2  Control/stop worrying 2  Worry too much - different things 3  Trouble relaxing 3  Restless 3  Easily annoyed or irritable 3  Afraid - awful might happen 3  Total GAD 7 Score 19  Anxiety Difficulty Somewhat difficult     ROS  Negative unless indicated in HPI   Objective:  BP (!) 99/55 (BP Location: Right Arm, Patient Position: Sitting, Cuff Size: Normal)   Pulse 99   Resp 16   Ht 5' 7 (1.702 m)   Wt 140 lb (63.5 kg)   LMP 08/03/2023   SpO2 100%   BMI 21.93 kg/m    Physical Exam Constitutional:      General: She is not in acute distress.    Appearance: Normal appearance. She is normal weight. She is not ill-appearing, toxic-appearing or diaphoretic.  HENT:     Head: Normocephalic.     Nose: Nose normal.     Mouth/Throat:      Mouth: Mucous membranes are moist.     Pharynx: Oropharynx is clear.  Eyes:     Extraocular Movements: Extraocular movements intact.     Pupils: Pupils are equal, round, and reactive to light.  Cardiovascular:     Rate and Rhythm: Normal rate and regular rhythm.     Pulses: Normal pulses.     Heart sounds: Normal heart sounds. No murmur heard.    No friction rub. No gallop.  Pulmonary:     Effort: No respiratory distress.     Breath sounds: No stridor. No wheezing, rhonchi or rales.  Chest:     Chest wall: No tenderness.  Musculoskeletal:     Right lower leg: No edema.     Left lower leg: No edema.  Skin:    General: Skin is warm and dry.     Capillary Refill: Capillary refill takes less than 2 seconds.  Neurological:     General: No focal deficit present.     Mental Status: She is alert and oriented to person, place, and time. Mental status is at baseline.  Psychiatric:        Attention and Perception: Attention normal.        Mood and Affect: Mood is anxious.        Speech: Speech normal.        Behavior: Behavior is hyperactive.        Thought Content: Thought content is paranoid. Thought content is not delusional. Thought content does not include homicidal or suicidal ideation. Thought content does not include homicidal or suicidal plan.        Cognition and Memory: Cognition and memory normal.        Judgment: Judgment normal.      No results found for any visits on 09/23/23.    The ASCVD Risk score (Arnett DK, et al., 2019) failed to calculate for the following reasons:   The 2019 ASCVD risk score is only valid for ages 60 to 79    Assessment & Plan:  GAD (generalized anxiety disorder) -     Escitalopram  Oxalate; Take 1 tablet (5 mg total) by mouth daily.  Dispense: 30 tablet; Refill: 1 -     hydrOXYzine  HCl; Take 1 tablet (10 mg total) by mouth 2 (two) times daily as needed.  Dispense: 30 tablet; Refill: 0  Anxiety about health  Depression, major, single  episode, moderate (HCC)  Chronic fatigue -     CBC with Differential/Platelet -     Celiac Disease Panel -     Food Allergy  Profile  Arthralgia, unspecified joint -     Celiac Disease Panel -     C-reactive protein -     Sedimentation rate -     Food Allergy  Profile  Palpitations -     Comprehensive metabolic panel with GFR -  TSH -     LONG TERM MONITOR (3-14 DAYS)  BMI 21.0-21.9, adult -     Lipid panel  Family history of diabetes mellitus -     Hemoglobin A1c -     Vitamin B12 -     VITAMIN D  25 Hydroxy (Vit-D Deficiency, Fractures)     Assessment and Plan Assessment & Plan Generalized anxiety disorder w/ depressive disorder Symptoms include palpitations, fatigue, and mood disturbances. Anxiety and depression may impact physical symptoms. High anxiety about health -long extensive discussion on purpose of screening, imaging process, and when studies get ordered. - Denies SI/HI - Start Lexapro  5 mg daily. - Declines CBT today, states has a counselor - she will reach out to - Prescribe hydroxyzine  10 mg as needed for anxiety. - Follow up in 4 weeks to assess mood and medication efficacy.  Palpitations and chest pain Cardiac vs Psychological in [place] - Intermittent palpitations and a single episode of sharp chest pain. - No chest pain today, appears resolved.  - does not appear to be related to MSK, no known injury, no pain to palpation - heart sounds normal - denies syncope, will hold on a ECHO - Previous EKG and chest X-ray were normal. Anxiety may contribute to symptoms. - Order Zio patch for 14-day cardiac monitoring. - Will check CMP, TSH, CBC  BMI = 21.93 - Continue efforts of exercise and health conscious diet.  Fatigue, headache, and joint pain Fatigue, headaches, and joint pain for two months, worsening over the past month. Previous labs showed slightly mildly low hemoglobin, possibly related to menstruation, as timely was when period started. Anxiety  and stress may exacerbate symptoms. - Order fasting labs including glucose, lipid panel, and thyroid function tests. - A1c, elevated glucose on previous BMP, fam hx of DMII - Screen for food allergies and celiac disease. - Recheck inflammation markers -  CBC, ESR, CRP  Chemical and food sensitivities Sensitivity to certain foods, chemicals, and fragrances, leading to joint pain and inflammation states patient Recommend journaling, avoid triggers Possible food allergies or intolerances?  - Screen for food allergies and celiac disease. - Recommend using unscented lotions, soaps, and detergents. - Advise avoiding chemical triggers and wearing a mask if necessary.  Dysmenorrhea Dysmenorrhea with previous negative evaluation for PCOS. Current menstrual cycle is regular with manageable cramping. Birth control options discussed but not currently desired. - Discuss birth control options if symptoms worsen or if desired in the future.   Return in about 1 month (around 10/26/2023) for Mood Check; can be virtual .    Curtis DELENA Boom, FNP

## 2023-09-25 ENCOUNTER — Ambulatory Visit: Payer: Self-pay | Admitting: Family Medicine

## 2023-09-25 DIAGNOSIS — Z91018 Allergy to other foods: Secondary | ICD-10-CM

## 2023-09-25 DIAGNOSIS — R5382 Chronic fatigue, unspecified: Secondary | ICD-10-CM

## 2023-09-25 DIAGNOSIS — M2550 Pain in unspecified joint: Secondary | ICD-10-CM

## 2023-09-27 LAB — CBC WITH DIFFERENTIAL/PLATELET
Basophils Absolute: 0.1 x10E3/uL (ref 0.0–0.2)
Basos: 1 %
EOS (ABSOLUTE): 0.4 x10E3/uL (ref 0.0–0.4)
Eos: 7 %
Hematocrit: 36 % (ref 34.0–46.6)
Hemoglobin: 11.3 g/dL (ref 11.1–15.9)
Immature Grans (Abs): 0 x10E3/uL (ref 0.0–0.1)
Immature Granulocytes: 0 %
Lymphocytes Absolute: 1.4 x10E3/uL (ref 0.7–3.1)
Lymphs: 27 %
MCH: 27.4 pg (ref 26.6–33.0)
MCHC: 31.4 g/dL — ABNORMAL LOW (ref 31.5–35.7)
MCV: 87 fL (ref 79–97)
Monocytes Absolute: 0.4 x10E3/uL (ref 0.1–0.9)
Monocytes: 7 %
Neutrophils Absolute: 3.1 x10E3/uL (ref 1.4–7.0)
Neutrophils: 58 %
Platelets: 376 x10E3/uL (ref 150–450)
RBC: 4.13 x10E6/uL (ref 3.77–5.28)
RDW: 11.8 % (ref 11.7–15.4)
WBC: 5.3 x10E3/uL (ref 3.4–10.8)

## 2023-09-27 LAB — COMPREHENSIVE METABOLIC PANEL WITH GFR
ALT: 12 IU/L (ref 0–32)
AST: 17 IU/L (ref 0–40)
Albumin: 4.6 g/dL (ref 4.0–5.0)
Alkaline Phosphatase: 89 IU/L (ref 44–121)
BUN/Creatinine Ratio: 11 (ref 9–23)
BUN: 8 mg/dL (ref 6–20)
Bilirubin Total: 0.5 mg/dL (ref 0.0–1.2)
CO2: 21 mmol/L (ref 20–29)
Calcium: 9.6 mg/dL (ref 8.7–10.2)
Chloride: 102 mmol/L (ref 96–106)
Creatinine, Ser: 0.73 mg/dL (ref 0.57–1.00)
Globulin, Total: 2.7 g/dL (ref 1.5–4.5)
Glucose: 87 mg/dL (ref 70–99)
Potassium: 4.2 mmol/L (ref 3.5–5.2)
Sodium: 140 mmol/L (ref 134–144)
Total Protein: 7.3 g/dL (ref 6.0–8.5)
eGFR: 118 mL/min/1.73 (ref >59)

## 2023-09-27 LAB — CELIAC DISEASE PANEL
Endomysial IgA: NEGATIVE
IgA/Immunoglobulin A, Serum: 110 mg/dL (ref 87–352)
Transglutaminase IgA: <2 U/mL (ref 0–3)

## 2023-09-27 LAB — FOOD ALLERGY PROFILE
Allergen Corn, IgE: 0.17 kU/L — AB
Clam IgE: <0.1 kU/L
Codfish IgE: <0.1 kU/L
Egg White IgE: <0.1 kU/L
Milk IgE: <0.1 kU/L
Peanut IgE: 1.21 kU/L — AB
Scallop IgE: <0.1 kU/L
Sesame Seed IgE: 0.25 kU/L — AB
Shrimp IgE: <0.1 kU/L
Soybean IgE: <0.1 kU/L
Walnut IgE: 0.17 kU/L — AB
Wheat IgE: <0.1 kU/L

## 2023-09-27 LAB — LIPID PANEL
Chol/HDL Ratio: 2.7 ratio (ref 0.0–4.4)
Cholesterol, Total: 155 mg/dL (ref 100–199)
HDL: 58 mg/dL (ref >39)
LDL Chol Calc (NIH): 88 mg/dL (ref 0–99)
Triglycerides: 38 mg/dL (ref 0–149)
VLDL Cholesterol Cal: 9 mg/dL (ref 5–40)

## 2023-09-27 LAB — SEDIMENTATION RATE: Sed Rate: 11 mm/h (ref 0–32)

## 2023-09-27 LAB — HEMOGLOBIN A1C
Est. average glucose Bld gHb Est-mCnc: 114 mg/dL
Hgb A1c MFr Bld: 5.6 % (ref 4.8–5.6)

## 2023-09-27 LAB — VITAMIN B12: Vitamin B-12: 640 pg/mL (ref 232–1245)

## 2023-09-27 LAB — TSH: TSH: 0.906 u[IU]/mL (ref 0.450–4.500)

## 2023-09-27 LAB — C-REACTIVE PROTEIN: CRP: <1 mg/L (ref 0–10)

## 2023-09-27 LAB — VITAMIN D 25 HYDROXY (VIT D DEFICIENCY, FRACTURES): Vit D, 25-Hydroxy: 21.8 ng/mL — ABNORMAL LOW (ref 30.0–100.0)

## 2023-10-27 ENCOUNTER — Ambulatory Visit: Admitting: Family Medicine

## 2023-10-27 ENCOUNTER — Encounter: Payer: Self-pay | Admitting: Family Medicine

## 2023-10-27 ENCOUNTER — Ambulatory Visit: Attending: Family Medicine

## 2023-10-27 VITALS — BP 111/73 | HR 59 | Temp 98.2°F | Ht 67.0 in | Wt 138.0 lb

## 2023-10-27 DIAGNOSIS — M2550 Pain in unspecified joint: Secondary | ICD-10-CM

## 2023-10-27 DIAGNOSIS — Z124 Encounter for screening for malignant neoplasm of cervix: Secondary | ICD-10-CM

## 2023-10-27 DIAGNOSIS — F411 Generalized anxiety disorder: Secondary | ICD-10-CM

## 2023-10-27 DIAGNOSIS — R002 Palpitations: Secondary | ICD-10-CM | POA: Diagnosis not present

## 2023-10-27 DIAGNOSIS — R5382 Chronic fatigue, unspecified: Secondary | ICD-10-CM | POA: Diagnosis not present

## 2023-10-27 DIAGNOSIS — Z01419 Encounter for gynecological examination (general) (routine) without abnormal findings: Secondary | ICD-10-CM

## 2023-10-27 DIAGNOSIS — Z91018 Allergy to other foods: Secondary | ICD-10-CM

## 2023-10-27 DIAGNOSIS — F321 Major depressive disorder, single episode, moderate: Secondary | ICD-10-CM

## 2023-10-27 DIAGNOSIS — E559 Vitamin D deficiency, unspecified: Secondary | ICD-10-CM

## 2023-10-27 MED ORDER — ESCITALOPRAM OXALATE 5 MG PO TABS: 5.0000 mg | ORAL_TABLET | Freq: Every day | ORAL | 0 refills | Status: DC

## 2023-10-27 MED ORDER — HYDROXYZINE HCL 10 MG PO TABS: 10.0000 mg | ORAL_TABLET | Freq: Two times a day (BID) | ORAL | 0 refills | Status: AC | PRN

## 2023-10-27 NOTE — Addendum Note (Signed)
 Addended by: Lanorris Kalisz on: 10/27/2023 12:52 PM   Modules accepted: Orders

## 2023-10-27 NOTE — Progress Notes (Addendum)
 Established Patient Office Visit  Introduced to nurse practitioner role and practice setting.  All questions answered.  Discussed provider/patient relationship and expectations.  Subjective   Patient ID: Meagan Smith, female    DOB: 09-05-2000  Age: 23 y.o. MRN: [id number]  Chief Complaint  Patient presents with   Anxiety   Nutrition Counseling    Patient states that she did not get the medication for her anxiety. She has changed her diet to an antiinflammatory diet.  She states she feels well now with those changes.  She does have appointment with allergist and will discuss it today.    Discussed the use of AI scribe software for clinical note transcription with the patient, who gave verbal consent to proceed.  History of Present Illness Meagan Smith is a 23 year old female who presents for follow-up regarding mood, palpitations, and generalized fatigue/joint pain.  She has been experiencing joint pain, which has improved with dietary changes, specifically adopting an anti-inflammatory diet. Joint pain occurs when consuming processed foods or foods not aligned with the anti-inflammatory diet. She avoids peanuts due to a moderate allergic reaction on food allergan panel, which she believes may have contributed to her joint pain.  She has not started Lexapro  yet, which was previously prescribed for mood management. She never picked up medication, so pharmacy cancelled her order. She would like to start it and needs a new prescription. Her mood score has significantly decreased. She takes vitamin D  daily to help with her deficiency, has improved fatigue.  Palpitations have improved, but has not received Zio patch  She is interested in scheduling an OB GYN wellness check as she has not had one in a few years and prefers to have a referral for this purpose.      10/27/2023    9:04 AM 09/23/2023   10:37 AM  Depression screen PHQ 2/9  Decreased Interest 1 3  Down, Depressed,  Hopeless 1 3  PHQ - 2 Score 2 6  Altered sleeping 0 3  Tired, decreased energy 0 3  Change in appetite 0 3  Feeling bad or failure about yourself  0 3  Trouble concentrating 0 3  Moving slowly or fidgety/restless 0 1  Suicidal thoughts 0 1  PHQ-9 Score 2 23  Difficult doing work/chores Not difficult at all Somewhat difficult       10/27/2023    9:04 AM 09/23/2023   10:38 AM  GAD 7 : Generalized Anxiety Score  Nervous, Anxious, on Edge 1 2  Control/stop worrying 0 2  Worry too much - different things 0 3  Trouble relaxing 1 3  Restless 0 3  Easily annoyed or irritable 0 3  Afraid - awful might happen 0 3  Total GAD 7 Score 2 19  Anxiety Difficulty Somewhat difficult Somewhat difficult     ROS  Negative unless indicated in HPI   Objective:     BP 111/73 (BP Location: Left Arm, Patient Position: Sitting, Cuff Size: Normal)   Pulse (!) 59   Temp 98.2 F (36.8 C) (Oral)   Ht 5' 7 (1.702 m)   Wt 138 lb (62.6 kg)   SpO2 100%   BMI 21.61 kg/m    Physical Exam Constitutional:      General: She is not in acute distress.    Appearance: Normal appearance. She is normal weight. She is not ill-appearing, toxic-appearing or diaphoretic.  HENT:     Head: Normocephalic.  Eyes:  Extraocular Movements: Extraocular movements intact.     Pupils: Pupils are equal, round, and reactive to light.  Cardiovascular:     Rate and Rhythm: Normal rate and regular rhythm.     Pulses: Normal pulses.          Radial pulses are 2+ on the right side and 2+ on the left side.       Posterior tibial pulses are 2+ on the right side and 2+ on the left side.     Heart sounds: Normal heart sounds. No murmur heard.    No friction rub. No gallop.  Pulmonary:     Effort: No respiratory distress.     Breath sounds: No stridor. No wheezing, rhonchi or rales.  Chest:     Chest wall: No tenderness.  Musculoskeletal:     Right lower leg: No edema.     Left lower leg: No edema.  Skin:     General: Skin is warm and dry.     Capillary Refill: Capillary refill takes less than 2 seconds.  Neurological:     General: No focal deficit present.     Mental Status: She is alert and oriented to person, place, and time. Mental status is at baseline.     GCS: GCS eye subscore is 4. GCS verbal subscore is 5. GCS motor subscore is 6.     Cranial Nerves: Cranial nerves 2-12 are intact.  Psychiatric:        Attention and Perception: Attention and perception normal.        Mood and Affect: Mood and affect normal.        Speech: Speech normal.        Behavior: Behavior normal. Behavior is cooperative.        Thought Content: Thought content normal.        Cognition and Memory: Cognition and memory normal.        Judgment: Judgment normal.      No results found for any visits on 10/27/23.    The ASCVD Risk score (Arnett DK, et al., 2019) failed to calculate for the following reasons:   The 2019 ASCVD risk score is only valid for ages 53 to 22    Assessment & Plan:  GAD (generalized anxiety disorder) -     hydrOXYzine  HCl; Take 1 tablet (10 mg total) by mouth 2 (two) times daily as needed.  Dispense: 30 tablet; Refill: 0 -     Escitalopram  Oxalate; Take 1 tablet (5 mg total) by mouth daily.  Dispense: 90 tablet; Refill: 0  Palpitations -     LONG TERM MONITOR (3-14 DAYS)  Food allergy   Chronic fatigue  Arthralgia, unspecified joint  Depression, major, single episode, moderate (HCC)  Vitamin D  deficiency     Assessment and Plan Assessment & Plan Food allergies (peanut, walnut, corn, sesame sensitivities on panel) Moderate sensitivity to peanuts may contribute to joint pain due to inflammation.  Walnuts, corn, and sesame showed lower sensitivity. - Provide phone number for allergist to schedule an appointment - pt has number, plans to call and schedule - Advise to avoid peanuts, walnuts, corn, and sesame until allergist evaluation  GAD and Depression Mood score has  significantly decreased- pt feels since changing her diet.  She has not started Lexapro  yet but expressed interest in still starting it - Prescribe Lexapro  5 mg daily - Prescribe hydroxyzine  as needed for panic symptoms - Follow up in six weeks for mood check, virtually if preferred  Palpitations - Intermittent, but improved - Zio patch reordered - previous labs unremarkable - If rhythms abnormal will send to cards for further eval  Vitamin D  Deficiency - continue daily supplement  Chronic fatigue/ arthralgias.  Joint pain has improved with dietary changes, specifically an anti-inflammatory diet.  Symptoms recur with consumption of processed foods or non-anti-inflammatory foods. - Continue anti-inflammatory diet  Referral to OB/Gyn for Gyn exam and pap - patient prefers well women through specialist.  Return in about 6 weeks (around 12/08/2023) for Mood Check - can be virtual .   I, Curtis DELENA Boom, FNP, have reviewed all documentation for this visit. The documentation on 10/27/23 for the exam, diagnosis, procedures, and orders are all accurate and complete.   Curtis DELENA Boom, FNP

## 2023-12-08 NOTE — Progress Notes (Unsigned)
 Gynecology Annual Exam  PCP: Wellington Curtis LABOR, FNP  Chief Complaint: No chief complaint on file.   History of Present Illness: Patient is a 23 y.o. No obstetric history on file. presents for annual exam. The patient has no complaints today.   LMP: No LMP recorded. Menarche:{numbers 0-81:85324} Average Interval: {Desc; regular/irreg:14544}, {numbers 22-35:14824} days Duration of flow: {numbers; 0-10:33138} days Heavy Menses: {yes/no:63} Clots: {yes/no:63} Intermenstrual Bleeding: {yes/no:63} Postcoital Bleeding: {yes/no:63} Dysmenorrhea: {yes/no:63}  The patient {sys sexually active:13135} sexually active. She currently uses {method:5051} for contraception. She {has/denies:315300} dyspareunia.  The patient {DOES_DOES WNU:81435} perform self breast exams.  There {is/is no:19420} notable family history of breast or ovarian cancer in her family.  The patient wears seatbelts: {yes/no:63}.  The patient has regular exercise: {yes/no/not asked:9010}.    The patient {Blank single:19197::repots,denies} current symptoms of depression.    Review of Systems: ROS  Past Medical History:  Patient Active Problem List   Diagnosis Date Noted   GAD (generalized anxiety disorder) 10/27/2023   Palpitations 10/27/2023   Food allergy  10/27/2023   Chronic fatigue 10/27/2023   Arthralgia 10/27/2023   Depression, major, single episode, moderate (HCC) 10/27/2023   Vitamin D  deficiency 10/27/2023    Past Surgical History:  No past surgical history on file.  Gynecologic History:  No LMP recorded. Contraception: {method:5051} Last Pap: Results were: *** {Findings; lab pap smear results:16707::NIL and HR HPV+,NIL and HR HPV negative}   Obstetric History: No obstetric history on file.  Family History:  Family History  Problem Relation Age of Onset   Healthy Mother    Healthy Father    Hypertension Maternal Grandmother    Diabetes Maternal Grandfather     Social History:   Social History   Socioeconomic History   Marital status: Single    Spouse name: Not on file   Number of children: Not on file   Years of education: Not on file   Highest education level: Not on file  Occupational History   Not on file  Tobacco Use   Smoking status: Never   Smokeless tobacco: Never  Vaping Use   Vaping status: Never Used  Substance and Sexual Activity   Alcohol use: Yes    Comment: occasional wine   Drug use: Never   Sexual activity: Not Currently  Other Topics Concern   Not on file  Social History Narrative   Not on file   Social Drivers of Health   Financial Resource Strain: Not on file  Food Insecurity: Not on file  Transportation Needs: Not on file  Physical Activity: Not on file  Stress: Not on file  Social Connections: Not on file  Intimate Partner Violence: Not on file    Allergies:  No Known Allergies  Medications: Prior to Admission medications   Medication Sig Start Date End Date Taking? Authorizing Provider  escitalopram  (LEXAPRO ) 5 MG tablet Take 1 tablet (5 mg total) by mouth daily. 10/27/23   Clifton, Kellie A, FNP  hydrOXYzine  (ATARAX ) 10 MG tablet Take 1 tablet (10 mg total) by mouth 2 (two) times daily as needed. 10/27/23   Wellington Curtis LABOR, FNP    Physical Exam Vitals: There were no vitals taken for this visit.  General: NAD HEENT: normocephalic, anicteric Thyroid: no enlargement, no palpable nodules Pulmonary: No increased work of breathing, CTAB Cardiovascular: RRR, distal pulses 2+ Breast: Breast symmetrical, no tenderness, no palpable nodules or masses, no skin or nipple retraction present, no nipple discharge.  No axillary or supraclavicular lymphadenopathy.  Abdomen: NABS, soft, non-tender, non-distended.  Umbilicus without lesions.  No hepatomegaly, splenomegaly or masses palpable. No evidence of hernia  Genitourinary:  External: Normal external female genitalia.  Normal urethral meatus, normal Bartholin's and Skene's  glands.    Vagina: Normal vaginal mucosa, no evidence of prolapse.    Cervix: Grossly normal in appearance, no bleeding  Uterus: Non-enlarged, mobile, normal contour.  No CMT  Adnexa: ovaries non-enlarged, no adnexal masses  Rectal: deferred  Lymphatic: no evidence of inguinal lymphadenopathy Extremities: no edema, erythema, or tenderness Neurologic: Grossly intact Psychiatric: mood appropriate, affect full  Female chaperone present for pelvic and breast  portions of the physical exam    Assessment: 23 y.o. No obstetric history on file. routine annual exam  Plan: Problem List Items Addressed This Visit   None   1) 4) Gardasil Series discussed and if applicable offered to patient - Patient {HAS HAS WNU:81165} previously completed 3 shot series   2) STI screening  {Blank single:19197::was,was not}offered and {Blank single:19197::accepted,declined,therefore not obtained}  3)  ASCCP guidelines and rational discussed.  Patient opts for {Blank single:19197::***,every 5 years,every 3 years,yearly,discontinue age >65,discontinue secondary to prior hysterectomy} screening interval  4) Contraception - the patient is currently using  {method:5051}.  She is {Blank single:19197::happy with her current form of contraception and plans to continue,interested in changing to ***,interested in starting Contraception: ***,not currently in need of contraception secondary to being sterile,attempting to conceive in the near future} We discussed safe sex practices to reduce her furture risk of STI's.    5) No follow-ups on file.   Jinnie Cookey, CNM  [facility] 12/08/2023, 11:20 AM

## 2023-12-09 ENCOUNTER — Other Ambulatory Visit (HOSPITAL_COMMUNITY)
Admission: RE | Admit: 2023-12-09 | Discharge: 2023-12-09 | Disposition: A | Discharge status: Home / Self Care | Source: Ambulatory Visit | Attending: Licensed Practical Nurse | Admitting: Licensed Practical Nurse

## 2023-12-09 ENCOUNTER — Encounter: Payer: Self-pay | Admitting: Licensed Practical Nurse

## 2023-12-09 ENCOUNTER — Ambulatory Visit: Admitting: Licensed Practical Nurse

## 2023-12-09 VITALS — BP 95/56 | HR 70 | Ht 66.5 in | Wt 137.4 lb

## 2023-12-09 DIAGNOSIS — Z124 Encounter for screening for malignant neoplasm of cervix: Secondary | ICD-10-CM | POA: Insufficient documentation

## 2023-12-09 DIAGNOSIS — Z01411 Encounter for gynecological examination (general) (routine) with abnormal findings: Secondary | ICD-10-CM

## 2023-12-09 DIAGNOSIS — Z01419 Encounter for gynecological examination (general) (routine) without abnormal findings: Secondary | ICD-10-CM | POA: Diagnosis not present

## 2023-12-09 DIAGNOSIS — N6325 Unspecified lump in the left breast, overlapping quadrants: Secondary | ICD-10-CM | POA: Diagnosis not present

## 2023-12-09 DIAGNOSIS — N6311 Unspecified lump in the right breast, upper outer quadrant: Secondary | ICD-10-CM | POA: Diagnosis not present

## 2023-12-10 ENCOUNTER — Telehealth: Admitting: Family Medicine

## 2023-12-10 ENCOUNTER — Encounter: Payer: Self-pay | Admitting: Family Medicine

## 2023-12-10 DIAGNOSIS — F411 Generalized anxiety disorder: Secondary | ICD-10-CM

## 2023-12-10 DIAGNOSIS — N63 Unspecified lump in unspecified breast: Secondary | ICD-10-CM | POA: Diagnosis not present

## 2023-12-10 MED ORDER — ESCITALOPRAM OXALATE 5 MG PO TABS: 5.0000 mg | ORAL_TABLET | Freq: Every day | ORAL | 1 refills | Status: AC

## 2023-12-10 NOTE — Progress Notes (Signed)
 Virtual Visit via Video Note  I connected with Coline D Jowers on 12/10/23 at  8:40 AM EST by a video enabled telemedicine application and verified that I am speaking with the correct person using two identifiers.  Patient Location: Home Provider Location: Office/Clinic  I discussed the limitations, risks, security, and privacy concerns of performing an evaluation and management service by video and the availability of in person appointments. I also discussed with the patient that there may be a patient responsible charge related to this service. The patient expressed understanding and agreed to proceed.  Subjective: PCP: Wellington Curtis LABOR, FNP  Chief Complaint  Patient presents with   Anxiety   HPI Discussed the use of AI scribe software for clinical note transcription with the patient, who gave verbal consent to proceed.  History of Present Illness Meagan Smith is a 23 year old female who presents for a follow-up on anxiety and depression management.  She is currently taking Lexapro  (escitalopram ) 5 mg daily for anxiety and depression. She reports that her anxiety symptoms are manageable and that she is experiencing some increase in anxiety due to life stressors.  She is continuing with dietary changes, specifically an anti-inflammatory diet, and is engaged in cognitive behavioral therapy.  She mentions a recent visit to the OBGYN where concerns about her breasts were raised, leading to an order for an ultrasound. She expresses stress about this due to a family history of breast issues on both sides of her family, including in younger family members.  She works in person and is involved with the [employer]. She plans to travel to [place]  for Thanksgiving to visit her mother's side of the family.  Flowsheet Row Telemedicine from 12/10/2023 in [facility]  PHQ-9 Total Score 3      12/10/2023    9:01 AM 10/27/2023    9:04 AM 09/23/2023    10:38 AM  GAD 7 : Generalized Anxiety Score  Nervous, Anxious, on Edge 1 1 2   Control/stop worrying 1 0 2  Worry too much - different things 2 0 3  Trouble relaxing 0 1 3  Restless 0 0 3  Easily annoyed or irritable 1 0 3  Afraid - awful might happen 0 0 3  Total GAD 7 Score 5 2 19   Anxiety Difficulty Not difficult at all Somewhat difficult Somewhat difficult      ROS: Per HPI  Current Outpatient Medications:    escitalopram  (LEXAPRO ) 5 MG tablet, Take 1 tablet (5 mg total) by mouth daily., Disp: 90 tablet, Rfl: 1   hydrOXYzine  (ATARAX ) 10 MG tablet, Take 1 tablet (10 mg total) by mouth 2 (two) times daily as needed. (Patient not taking: Reported on 12/09/2023), Disp: 30 tablet, Rfl: 0  Observations/Objective: There were no vitals filed for this visit. Physical Exam Constitutional:      General: She is not in acute distress.    Appearance: Normal appearance. She is not ill-appearing, toxic-appearing or diaphoretic.  Eyes:     Extraocular Movements: Extraocular movements intact.     Pupils: Pupils are equal, round, and reactive to light.  Pulmonary:     Effort: Pulmonary effort is normal.  Musculoskeletal:        General: Normal range of motion.     Cervical back: Normal range of motion.  Neurological:     General: No focal deficit present.     Mental Status: She is alert and oriented to person, place, and time.  Mental status is at baseline.     GCS: GCS eye subscore is 4. GCS verbal subscore is 5. GCS motor subscore is 6.     Cranial Nerves: No cranial nerve deficit.  Psychiatric:        Attention and Perception: Attention and perception normal.        Mood and Affect: Mood and affect normal.        Speech: Speech normal.        Behavior: Behavior normal. Behavior is cooperative.        Thought Content: Thought content normal.        Cognition and Memory: Cognition and memory normal.        Judgment: Judgment normal.     Assessment and Plan: GAD (generalized  anxiety disorder) -     Escitalopram  Oxalate; Take 1 tablet (5 mg total) by mouth daily.  Dispense: 90 tablet; Refill: 1  Breast mass in female    Assessment and Plan Assessment & Plan Generalized anxiety disorder and depressive disorder Reports improvement in anxiety and mood since starting escitalopram  5 mg daily.  - Monitor symptoms and consider dose adjustment if symptoms worsen. - Continue escitalopram  5mg  daily - Continue cognitive behavioral therapy.  Breast mass, unspecified Undergoing evaluation for breast mass finding during GYN well women visit on 12/09/23. GYN ordered an ultrasound to rule out concerns, likely hormonal or fibrocystic in nature per GYN. Family history of breast issues increases concern. Stressful due to family history and uncertainty. - Pt plans to schedule breast ultrasound as ordered by GYN. - Follow up with GYN if no contact regarding scheduling within a week.   Follow Up Instructions: Return in about 6 months (around 06/08/2024) for mood mgmt.   I discussed the assessment and treatment plan with the patient. The patient was provided an opportunity to ask questions, and all were answered. The patient agreed with the plan and demonstrated an understanding of the instructions.   The patient was advised to call back or seek an in-person evaluation if the symptoms worsen or if the condition fails to improve as anticipated.  The above assessment and management plan was discussed with the patient. The patient verbalized understanding of and has agreed to the management plan.   Curtis DELENA Boom, FNP

## 2023-12-12 LAB — CYTOLOGY - PAP
Comment: NEGATIVE
Diagnosis: NEGATIVE
High risk HPV: NEGATIVE

## 2023-12-14 DIAGNOSIS — F411 Generalized anxiety disorder: Secondary | ICD-10-CM | POA: Diagnosis not present

## 2023-12-14 DIAGNOSIS — F330 Major depressive disorder, recurrent, mild: Secondary | ICD-10-CM | POA: Diagnosis not present

## 2023-12-15 ENCOUNTER — Ambulatory Visit: Payer: Self-pay | Admitting: Licensed Practical Nurse

## 2023-12-17 ENCOUNTER — Other Ambulatory Visit: Payer: Self-pay | Admitting: Licensed Practical Nurse

## 2023-12-17 DIAGNOSIS — N6310 Unspecified lump in the right breast, unspecified quadrant: Secondary | ICD-10-CM

## 2023-12-17 DIAGNOSIS — N6325 Unspecified lump in the left breast, overlapping quadrants: Secondary | ICD-10-CM

## 2023-12-21 DIAGNOSIS — F330 Major depressive disorder, recurrent, mild: Secondary | ICD-10-CM | POA: Diagnosis not present

## 2023-12-21 DIAGNOSIS — F411 Generalized anxiety disorder: Secondary | ICD-10-CM | POA: Diagnosis not present

## 2023-12-28 DIAGNOSIS — F330 Major depressive disorder, recurrent, mild: Secondary | ICD-10-CM | POA: Diagnosis not present

## 2023-12-28 DIAGNOSIS — F411 Generalized anxiety disorder: Secondary | ICD-10-CM | POA: Diagnosis not present

## 2024-01-01 ENCOUNTER — Ambulatory Visit
Admission: RE | Admit: 2024-01-01 | Discharge: 2024-01-01 | Disposition: A | Source: Ambulatory Visit | Attending: Licensed Practical Nurse | Admitting: Licensed Practical Nurse

## 2024-01-01 ENCOUNTER — Inpatient Hospital Stay
Admission: RE | Admit: 2024-01-01 | Discharge: 2024-01-01 | Disposition: A | Source: Ambulatory Visit | Attending: Licensed Practical Nurse | Admitting: Licensed Practical Nurse

## 2024-01-01 DIAGNOSIS — N6320 Unspecified lump in the left breast, unspecified quadrant: Secondary | ICD-10-CM | POA: Diagnosis not present

## 2024-01-01 DIAGNOSIS — N6325 Unspecified lump in the left breast, overlapping quadrants: Secondary | ICD-10-CM

## 2024-01-01 DIAGNOSIS — N6310 Unspecified lump in the right breast, unspecified quadrant: Secondary | ICD-10-CM

## 2024-01-01 DIAGNOSIS — N644 Mastodynia: Secondary | ICD-10-CM | POA: Diagnosis not present

## 2024-01-04 DIAGNOSIS — F330 Major depressive disorder, recurrent, mild: Secondary | ICD-10-CM | POA: Diagnosis not present

## 2024-01-04 DIAGNOSIS — F411 Generalized anxiety disorder: Secondary | ICD-10-CM | POA: Diagnosis not present

## 2024-01-11 DIAGNOSIS — F330 Major depressive disorder, recurrent, mild: Secondary | ICD-10-CM | POA: Diagnosis not present

## 2024-01-11 DIAGNOSIS — F411 Generalized anxiety disorder: Secondary | ICD-10-CM | POA: Diagnosis not present

## 2024-01-13 ENCOUNTER — Inpatient Hospital Stay
Admission: RE | Admit: 2024-01-13 | Discharge: 2024-01-13 | Disposition: A | Discharge status: Home / Self Care | Payer: Self-pay | Source: Ambulatory Visit | Attending: Licensed Practical Nurse | Admitting: Licensed Practical Nurse

## 2024-01-13 ENCOUNTER — Other Ambulatory Visit: Payer: Self-pay | Admitting: *Deleted

## 2024-01-13 DIAGNOSIS — Z1231 Encounter for screening mammogram for malignant neoplasm of breast: Secondary | ICD-10-CM

## 2024-01-14 DIAGNOSIS — Z113 Encounter for screening for infections with a predominantly sexual mode of transmission: Secondary | ICD-10-CM | POA: Diagnosis not present

## 2024-01-18 DIAGNOSIS — F330 Major depressive disorder, recurrent, mild: Secondary | ICD-10-CM | POA: Diagnosis not present

## 2024-01-18 DIAGNOSIS — F411 Generalized anxiety disorder: Secondary | ICD-10-CM | POA: Diagnosis not present

## 2024-01-27 DIAGNOSIS — F411 Generalized anxiety disorder: Secondary | ICD-10-CM | POA: Diagnosis not present

## 2024-01-27 DIAGNOSIS — F330 Major depressive disorder, recurrent, mild: Secondary | ICD-10-CM | POA: Diagnosis not present
